# Patient Record
Sex: Female | Born: 1980 | Race: White | Hispanic: No | Marital: Single | Smoking: Current every day smoker
Health system: Southern US, Community
[De-identification: ages and names within clinical notes are randomized; demographics above are authoritative.]

## PROBLEM LIST (undated history)

## (undated) DIAGNOSIS — Z8711 Personal history of peptic ulcer disease: Secondary | ICD-10-CM

## (undated) DIAGNOSIS — N809 Endometriosis, unspecified: Secondary | ICD-10-CM

## (undated) DIAGNOSIS — Z8719 Personal history of other diseases of the digestive system: Secondary | ICD-10-CM

## (undated) HISTORY — PX: ABDOMINAL SURGERY: SHX537

---
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
## 2002-10-19 ENCOUNTER — Other Ambulatory Visit: Admission: RE | Admit: 2002-10-19 | Discharge: 2002-10-19 | Payer: Self-pay | Admitting: Obstetrics and Gynecology

## 2002-12-01 ENCOUNTER — Encounter: Payer: Self-pay | Admitting: *Deleted

## 2002-12-01 ENCOUNTER — Emergency Department (HOSPITAL_COMMUNITY): Admission: EM | Admit: 2002-12-01 | Discharge: 2002-12-01 | Payer: Self-pay

## 2003-08-22 ENCOUNTER — Other Ambulatory Visit: Admission: RE | Admit: 2003-08-22 | Discharge: 2003-08-22 | Payer: Self-pay | Admitting: Obstetrics and Gynecology

## 2003-11-09 ENCOUNTER — Ambulatory Visit (HOSPITAL_COMMUNITY): Admission: RE | Admit: 2003-11-09 | Discharge: 2003-11-09 | Payer: Self-pay | Admitting: Obstetrics and Gynecology

## 2004-02-15 ENCOUNTER — Inpatient Hospital Stay (HOSPITAL_COMMUNITY): Admission: AD | Admit: 2004-02-15 | Discharge: 2004-02-19 | Payer: Self-pay | Admitting: Obstetrics and Gynecology

## 2004-02-16 ENCOUNTER — Encounter (INDEPENDENT_AMBULATORY_CARE_PROVIDER_SITE_OTHER): Payer: Self-pay | Admitting: *Deleted

## 2005-01-15 ENCOUNTER — Emergency Department (HOSPITAL_COMMUNITY): Admission: EM | Admit: 2005-01-15 | Discharge: 2005-01-15 | Payer: Self-pay | Admitting: Emergency Medicine

## 2005-09-15 ENCOUNTER — Ambulatory Visit: Payer: Self-pay | Admitting: Gastroenterology

## 2005-09-17 ENCOUNTER — Ambulatory Visit: Payer: Self-pay | Admitting: Gastroenterology

## 2005-10-07 ENCOUNTER — Ambulatory Visit: Payer: Self-pay | Admitting: Gastroenterology

## 2006-08-20 ENCOUNTER — Ambulatory Visit (HOSPITAL_COMMUNITY): Admission: RE | Admit: 2006-08-20 | Discharge: 2006-08-20 | Payer: Self-pay | Admitting: Obstetrics and Gynecology

## 2007-10-04 ENCOUNTER — Emergency Department (HOSPITAL_COMMUNITY): Admission: EM | Admit: 2007-10-04 | Discharge: 2007-10-04 | Payer: Self-pay | Admitting: Family Medicine

## 2008-04-27 HISTORY — PX: OTHER SURGICAL HISTORY: SHX169

## 2008-04-27 HISTORY — PX: TUBAL LIGATION: SHX77

## 2009-01-11 ENCOUNTER — Inpatient Hospital Stay (HOSPITAL_COMMUNITY): Admission: AD | Admit: 2009-01-11 | Discharge: 2009-01-11 | Payer: Self-pay | Admitting: Obstetrics and Gynecology

## 2009-01-21 ENCOUNTER — Inpatient Hospital Stay (HOSPITAL_COMMUNITY): Admission: RE | Admit: 2009-01-21 | Discharge: 2009-01-24 | Payer: Self-pay | Admitting: Obstetrics and Gynecology

## 2009-01-21 ENCOUNTER — Encounter (INDEPENDENT_AMBULATORY_CARE_PROVIDER_SITE_OTHER): Payer: Self-pay | Admitting: Obstetrics and Gynecology

## 2009-04-30 ENCOUNTER — Emergency Department (HOSPITAL_COMMUNITY): Admission: EM | Admit: 2009-04-30 | Discharge: 2009-04-30 | Payer: Self-pay | Admitting: Emergency Medicine

## 2010-04-10 ENCOUNTER — Emergency Department (HOSPITAL_COMMUNITY)
Admission: EM | Admit: 2010-04-10 | Discharge: 2010-04-10 | Payer: Self-pay | Source: Home / Self Care | Admitting: Emergency Medicine

## 2010-07-13 LAB — POCT I-STAT, CHEM 8
BUN: 6 mg/dL (ref 6–23)
Calcium, Ion: 1.2 mmol/L (ref 1.12–1.32)
Chloride: 102 mEq/L (ref 96–112)
Creatinine, Ser: 0.6 mg/dL (ref 0.4–1.2)
Glucose, Bld: 113 mg/dL — ABNORMAL HIGH (ref 70–99)
HCT: 37 % (ref 36.0–46.0)
Hemoglobin: 12.6 g/dL (ref 12.0–15.0)
Potassium: 4 mEq/L (ref 3.5–5.1)
Sodium: 138 mEq/L (ref 135–145)
TCO2: 29 mmol/L (ref 0–100)

## 2010-08-01 LAB — COMPREHENSIVE METABOLIC PANEL
ALT: 13 U/L (ref 0–35)
Alkaline Phosphatase: 145 U/L — ABNORMAL HIGH (ref 39–117)
CO2: 23 mEq/L (ref 19–32)
Calcium: 9 mg/dL (ref 8.4–10.5)
Creatinine, Ser: 0.63 mg/dL (ref 0.4–1.2)
Glucose, Bld: 76 mg/dL (ref 70–99)
Total Bilirubin: 0.4 mg/dL (ref 0.3–1.2)

## 2010-08-01 LAB — CBC
HCT: 30.9 % — ABNORMAL LOW (ref 36.0–46.0)
Hemoglobin: 12 g/dL (ref 12.0–15.0)
MCHC: 33.9 g/dL (ref 30.0–36.0)
MCV: 98.5 fL (ref 78.0–100.0)
RBC: 3.59 MIL/uL — ABNORMAL LOW (ref 3.87–5.11)
RDW: 15 % (ref 11.5–15.5)
WBC: 8.8 10*3/uL (ref 4.0–10.5)

## 2010-08-01 LAB — DIFFERENTIAL
Basophils Absolute: 0 10*3/uL (ref 0.0–0.1)
Eosinophils Relative: 0 % (ref 0–5)
Lymphocytes Relative: 19 % (ref 12–46)
Lymphs Abs: 1.7 10*3/uL (ref 0.7–4.0)
Monocytes Absolute: 0.4 10*3/uL (ref 0.1–1.0)

## 2010-08-01 LAB — RPR: RPR Ser Ql: NONREACTIVE

## 2010-09-12 NOTE — Discharge Summary (Signed)
NAMEMAKAYLIA, [name] NO.:  [id number]   MEDICAL RECORD NO.:  [id number]          PATIENT TYPE:  INP   LOCATION:  9117                          FACILITY:  [facility]   PHYSICIAN:  Malachi Pro. Ambrose Mantle, M.D. DATE OF BIRTH:  17-Oct-1980   DATE OF ADMISSION:  02/15/2004  DATE OF DISCHARGE:                                 DISCHARGE SUMMARY   HOSPITAL COURSE:  This is a 30 year old white single female para 0 gravida  1; EDC February 22, 2004; admitted for induction of labor.  Blood group and  type A positive, negative antibody, nonreactive serology, rubella immune,  hepatitis B surface antigen negative, HIV declined, GC and chlamydia  negative, triple screen normal, 1-hour Glucola 139, group B strep negative.  The patient's prenatal course and progress in labor is detailed in the  present illness.  In summary, she reached close to full dilatation, remained  at 9 cm, and underwent a low transverse cervical C-section by Dr. Ambrose Mantle  under epidural anesthesia for arrest of dilatation, relative CPD, and  persistent occiput posterior.  Postpartum, the patient did well and was  discharged on postpartum day #3.   LABORATORY DATA:  Initial hemoglobin 10.9; hematocrit 32.0; white count  8900; platelet count 311,000.  Follow-up hemoglobin 8.6, hematocrit 25.4.  RPR nonreactive.   FINAL DIAGNOSIS:  1.  Intrauterine pregnancy at 39 weeks, delivered by cesarean section.  2.  Relative cephalopelvic disproportion.  3.  Arrest of dilatation at 9 cm.  4.  Persistent occiput posterior.   OPERATION:  Low transverse cervical C-section.   FINAL CONDITION:  Improved.   Instructions include our regular discharge instruction booklet.  The patient  is advised to return to the office in 10-14 days for follow-up examination.  Percocet 5/325 #24 tablets one q.4-6h. as needed for pain is given at  discharge.      TFH/MEDQ  D:  02/19/2004  T:  02/19/2004  Job:  [id number]

## 2010-09-12 NOTE — Op Note (Signed)
NAMEBRANDON, Erika [name]              ACCOUNT NO.:  [id number]   MEDICAL RECORD NO.:  [id number]          PATIENT TYPE:  INP   LOCATION:  9165                          FACILITY:  [facility]   PHYSICIAN:  Malachi Pro. Ambrose Mantle, M.D. DATE OF BIRTH:  [birth year]   DATE OF PROCEDURE:  02/16/2004  DATE OF DISCHARGE:                                 OPERATIVE REPORT   PREOPERATIVE DIAGNOSES:  1.  Intrauterine pregnancy at 39 weeks.  2.  Arrest of dilatation.  3.  Relative cephalopelvic disproportion presumed persistent OP.   POSTOPERATIVE DIAGNOSES:  1.  Intrauterine pregnancy at 39 weeks.  2.  Arrest of dilatation.  3.  Relative cephalopelvic disproportion presumed persistent OP.   OPERATION:  Low transverse cervical cesarean section.   SURGEON:  Malachi Pro. Ambrose Mantle, M.D.   ANESTHESIA:  Epidural.   DESCRIPTION OF PROCEDURE:  The patient was brought to the operating room and  the epidural anesthetic was boosted. Fetal heart tones were confirmed to be  normal. The abdomen was prepped with Betadine solution and draped as a  sterile field. Anesthesia was confirmed, a transverse incision was made and  carried in layers through the skin, subcutaneous tissue and fascia. The  fascia was then separated from the rectus muscle superiorly and inferiorly.  The rectus muscle was cut in the midline, peritoneum was opened vertically.  Exploration of the lower uterine segment revealed the bladder to be well  below the operative field. I made an incision into the uterus and then made  my way into the amniotic sac with blunt dissection. I enlarged the incision  by pulling superiorly and inferiorly, reached down, confirmed that the  vertex was in an OP position.  I rotated the vertex around, it delivered  through the incisional opening, the nose and pharynx were suctioned with the  bulb. The baby was delivered through the incisional opening with fundal  pressure and with my fingers in the baby's axilla. The cord was  clamped, the  infant was given to Angelita Ingles, M.D. who was in attendance. He assigned  the 7 pound 14 ounce female Apgar's of 8 at 1 and 9 at 5 minutes. A segment  of cord was preserved in case a pH was necessary but when the Apgar's were  normal, I discarded the loop of cord. Routine cord blood studies were  obtained, the placenta was then removed. The inside of the uterus was  inspected and found to be free of debris. I did manually massage the uterus  for 2 or 3 minutes trying to get real good tone before I turned by attention  to the incision. I grasped the incision with ring forceps, closed the  incision with two sutures of #0 Vicryl locking the first layer, nonlocking  on the second layer. Liberal irrigation confirmed hemostasis, the uterus,  tubes and ovaries were all normal. I did not reapproximate the visceral  peritoneum or the parietoperitoneum. I closed the rectus muscle with  interrupted sutures of #0 Vicryl, the fascia with two running sutures of  what we presumed to be #0 Vicryl although I had  been given the wrong suture  and the left side of her incision was closed with 3-0 Vicryl. I was not  satisfied with 3-0 Vicryl closing the fascia so the left side of the fascial  incision was reinforced with multiple interrupted figure-of-eight sutures of  #0 Vicryl. The  subcu tissue was then closed with a running 3-0 Vicryl and the skin was  closed with automatic staples. The patient seemed to tolerate the procedure  well. Blood loss was about 1000 mL.  Sponge and needle counts were correct  and the patient was returned to recovery in satisfactory condition.      TFH/MEDQ  D:  02/16/2004  T:  02/16/2004  Job:  [id number]

## 2010-09-12 NOTE — H&P (Signed)
NAMEKALEIGHA, CHAMBERLIN              ACCOUNT NO.:  [id number]   MEDICAL RECORD NO.:  [id number]          PATIENT TYPE:  INP   LOCATION:  9165                          [facility]ACILITY:  [facility]   PHYSICIAN:  Malachi Pro. Ambrose Mantle, M.D. DATE OF BIRTH:  08/08/1980   DATE OF ADMISSION:  02/15/2004  DATE OF DISCHARGE:                                HISTORY & PHYSICAL   HISTORY OF PRESENT ILLNESS:  A 30 year old white single female, para 0,  gravida 1, EDC February 22, 2004, by dates and ultrasound, admitted for  induction of labor.  Blood group and type A positive, negative antibody,  Nonreactive serology.  Rubella immune.  Hepatitis B surface antigen  negative.  HIV declined.  GC and Chlamydia negative.  Triple screen normal.  One-hour Glucola 139.  Group G strep negative.  Vaginal ultrasound on August 06, 2003:  Crown-rump length 4.61 cm, 11 weeks 3 days, [facility] February 22, 2004.  Repeat ultrasound on October 22, 2003, average gestational age [redacted] weeks 4 days,  [facility] February 22, 2004.  The patient was exposed to Parvovirus.  IgG was 6.2,  IgM was 0.1, indicating immunity.  Prenatal care was uncomplicated.  The  patient was admitted for induction.   PAST MEDICAL HISTORY:  No known drug allergies.   Illnesses:  History of migraines.   Operations:  None.   FAMILY HISTORY:  Maternal grandfather with an MI and diabetes.  Mother with  thyroid dysfunction.   Alcohol, tobacco, and drugs:  None.   PHYSICAL EXAMINATION:  VITAL SIGNS:  On admission her temperature was 98.2,  blood pressure 110/66, pulse 103, respirations 20.  HEENT:  Normal.  CARDIAC:  Heart normal size and sounds, no murmurs.  CHEST:  Lungs clear to auscultation.  ABDOMEN:  Soft, fundal height 38 cm.  PELVIC:  On February 13, 2004, cervix was a fingertip, 50%, vertex at a -2.   Artificial rupture of membranes was attempted on admission but was  unsuccessful mainly because the patient is quite apprehensive about exams.  She was placed on Pitocin.   Pitocin was raised to 28 milliunits a minute.  At 3:15 p.m. her contractions were every two minutes but were painless.  The  cervix was a fingertip and 50%, vertex at a -2, and artificial rupture of  membranes produced clear fluid.  By 6:22 p.m., the Pitocin was at 30  milliunits a minute, contractions were every two to three minutes.  Cervix  per the nurse was 2-3 cm and 80%.  She failed to make much progress  thereafter for several hours and was given an epidural.  She then made slow  but steady progress to 9 cm dilatation at about 3 a.m. on February 16, 2004.  Thereafter, she failed to make progress.  The cervix remained 9 cm dilated.  I have examined her and found the vertex to be at a +1 station, quite  molded, but the cervix is still 9 cm and since approximately 5:20 a.m., the  patient has had some variable decelerations.  I am going to proceed with C-  section because of arrest of  dilatation at 9 cm.   IMPRESSION:  1.  Intrauterine pregnancy at 39 weeks.  2.  Arrest of dilatation.  3.  Suspected cephalopelvic disproportion.      TFH/MEDQ  D:  02/16/2004  T:  02/16/2004  Job:  [id number]

## 2010-09-12 NOTE — Discharge Summary (Signed)
NAMEJASLIN, [name]              ACCOUNT NO.:  [id number]   MEDICAL RECORD NO.:  [id number]          PATIENT TYPE:  OIB   LOCATION:  9305                          [facility]ACILITY:  [facility]   PHYSICIAN:  Malachi Pro. Ambrose Mantle, M.D. DATE OF BIRTH:  30-May-1980   DATE OF ADMISSION:  08/20/2006  DATE OF DISCHARGE:  08/20/2006                               DISCHARGE SUMMARY   HISTORY OF PRESENT ILLNESS:  This is a 30 year old white female para 1-0-  0-1 with chronic pelvic pain.  The patient was admitted through the  operating room after having undergone a diagnostic laparoscopy,  minilaparotomy and division of adhesions of the omentum to the uterus  and adhesions of the uterus to the anterior abdominal wall especially of  the anterior fundus and the left area at the left round ligament.  Because of the extended nature of the procedure the patient was admitted  to the hospital and kept on observation until approximately 8 hours  postop and she was discharged.   LABORATORY DATA:  Showed initial hemoglobin of 12.7, hematocrit 37.3,  white count 5600, platelet count 422,000.  Follow-up hemoglobin 11.6,  hematocrit 33.9.  Comprehensive metabolic profile was normal.   FINAL DIAGNOSIS:  Chronic pelvic pain.   OPERATION:  Diagnostic laparoscopy, minilaparotomy, division of  adhesions of the omentum to the uterus and the uterine adhesions to the  anterior abdominal wall and anterior fundus and the area of the left  round ligament.   FINAL CONDITION:  Improved.   Instructions include no vaginal entrance, no heavy lifting or strenuous  activity.  Call with any unusual problems. Percocet 5/325 24 tablets one  or two every 3-4 hours as needed for pain. The patient is return to the  office at 1 week for follow-up examination.      Malachi Pro. Ambrose Mantle, M.D.  Electronically Signed     TFH/MEDQ  D:  09/28/2006  T:  09/28/2006  Job:  [id number]

## 2010-09-12 NOTE — Op Note (Signed)
Erika [name], Erika [name] NO.:  [id number]   MEDICAL RECORD NO.:  [id number]          PATIENT TYPE:  AMB   LOCATION:  SDC                           [facility]ACILITY:  [facility]   PHYSICIAN:  Malachi Pro. Ambrose Mantle, M.D. DATE OF BIRTH:  10/31/1980   DATE OF PROCEDURE:  08/20/2006  DATE OF DISCHARGE:                               OPERATIVE REPORT   PREOPERATIVE DIAGNOSIS:  Chronic pelvic pain.   POSTOPERATIVE DIAGNOSIS:  Chronic pelvic pain, with adherence of the  anterior aspect of the uterus to the abdominal wall especially over the  mid part of the uterus and the area of the left round ligament.  The  right side was relatively free.   OPERATION:  Diagnostic laparoscopy, minilaparotomy with division of the  dense adhesions over the anterior aspect of the uterus to the anterior  abdominal wall and in the area of the left round ligament to the  abdominal wall.   OPERATOR:  Dr. Ambrose Mantle.   General anesthesia.  The patient was brought to the operating room and  placed under satisfactory general anesthesia and placed in [place]  stirrups.  It was noted after the patient was asleep that she had not  signed her permit, but she knew what was to be done.  We consulted with  the OR staff, and the idea was to do the operation and then have her  sign an addendum at the end of the operation.  The abdomen, vulva,  vagina and urethra were prepped with Betadine solution.  The bladder was  emptied with a Foley catheter.  Exam revealed the uterus to be midplane  and normal size.  The adnexa were free of masses.  The cervix was drawn  into the operative field, and a Hulka cannula was placed into the uterus  and attached to the posterior cervical lip.  The patient was placed in  Arthur stirrups already.  Her legs were drawn out a little bit.  The  lower part of the umbilicus was incised and identified the fascia.  I  opened the fascia with a knife and then entered the peritoneal cavity.  I placed a 0  Vicryl suture pursestring around the fascial opening, then  inserted the Hassan cannula into the peritoneal cavity.  I could tell  that there were dense adhesions to the abdominal wall that I did not  want to use electrical current on, so I removed the laparoscope, pulled  down the pursestring suture and then put an additional figure-of-eight  suture through the fascia.  Closed the subcu tissue with 3-0 Vicryl and  the skin with interrupted 3-0 plain catgut.  I then made probably a 2-  inch incision suprapubically through the old C-section scar, carried in  layers through the skin, subcutaneous tissue and fascia.  The fascia was  separated from the rectus muscles superiorly and inferiorly.  I entered  the peritoneal cavity.  The incision was not large enough to use a  normal retractor, so we just used Goulet retractors, [employer]  retractors and suture material to try to do the surgery.  With blunt and  sharp dissection, I tried to create the anterior cul-de-sac which I was  able to do.  The greatest part of the procedure was spent trying to  separate the left anterior aspect of the uterus and area of the round  ligament to the abdominal wall.  I was very cautious in that area  because I could not tell exactly where the tube and ovary originated.  Ultimately, I was able to determine that the tube and ovary on the left  originated well below where I was working, and then I felt more freedom  to divide the tissue in that area with more abandon.  At the end of the  procedure, the uterus had been completely separated from the abdominal  wall.  Both tubes and ovaries appeared normal and were well free of the  operative field.  I had used a 0 Vicryl suture in the uterus to provide  elevation.  I did find that hemostasis was complete, and I placed an  Interceed across the anterior aspect of the uterus in attempt to prevent  re-formation of the same scar tissue that she had had.  I used one pack;   it was removed.  I  closed the rectus muscle with 0 Vicryl interrupted sutures, the fascia  with multiple interrupted figure-of-eight sutures of 0 Vicryl, the subcu  with a running 3-0 Vicryl, and the skin was closed with 3-0 plain  catgut.  The patient seemed to tolerate the procedure well and returned  to recovery in satisfactory condition.      Malachi Pro. Ambrose Mantle, M.D.  Electronically Signed     TFH/MEDQ  D:  08/20/2006  T:  08/20/2006  Job:  [id number]
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
## 2011-02-20 ENCOUNTER — Emergency Department (HOSPITAL_COMMUNITY)
Admission: EM | Admit: 2011-02-20 | Discharge: 2011-02-21 | Disposition: A | Discharge status: Home / Self Care | Payer: Self-pay | Attending: Emergency Medicine | Admitting: Emergency Medicine

## 2011-02-20 ENCOUNTER — Emergency Department (HOSPITAL_COMMUNITY): Payer: Self-pay

## 2011-02-20 DIAGNOSIS — M25579 Pain in unspecified ankle and joints of unspecified foot: Secondary | ICD-10-CM | POA: Insufficient documentation

## 2011-02-20 DIAGNOSIS — W19XXXA Unspecified fall, initial encounter: Secondary | ICD-10-CM | POA: Insufficient documentation

## 2011-02-20 DIAGNOSIS — Y92009 Unspecified place in unspecified non-institutional (private) residence as the place of occurrence of the external cause: Secondary | ICD-10-CM | POA: Insufficient documentation

## 2011-02-20 DIAGNOSIS — S8263XA Displaced fracture of lateral malleolus of unspecified fibula, initial encounter for closed fracture: Secondary | ICD-10-CM | POA: Insufficient documentation

## 2011-11-05 ENCOUNTER — Emergency Department (HOSPITAL_COMMUNITY): Payer: Self-pay

## 2011-11-05 ENCOUNTER — Emergency Department (HOSPITAL_COMMUNITY)
Admission: EM | Admit: 2011-11-05 | Discharge: 2011-11-06 | Disposition: A | Discharge status: Home / Self Care | Payer: Self-pay | Attending: Emergency Medicine | Admitting: Emergency Medicine

## 2011-11-05 ENCOUNTER — Encounter (HOSPITAL_COMMUNITY): Payer: Self-pay | Admitting: *Deleted

## 2011-11-05 DIAGNOSIS — N949 Unspecified condition associated with female genital organs and menstrual cycle: Secondary | ICD-10-CM | POA: Insufficient documentation

## 2011-11-05 DIAGNOSIS — N390 Urinary tract infection, site not specified: Secondary | ICD-10-CM | POA: Insufficient documentation

## 2011-11-05 DIAGNOSIS — R102 Pelvic and perineal pain: Secondary | ICD-10-CM

## 2011-11-05 HISTORY — DX: Endometriosis, unspecified: N80.9

## 2011-11-05 LAB — CBC WITH DIFFERENTIAL/PLATELET
Basophils Absolute: 0 10*3/uL (ref 0.0–0.1)
HCT: 40.5 % (ref 36.0–46.0)
Hemoglobin: 13.9 g/dL (ref 12.0–15.0)
Lymphocytes Relative: 41 % (ref 12–46)
Lymphs Abs: 3.1 10*3/uL (ref 0.7–4.0)
Monocytes Absolute: 0.3 10*3/uL (ref 0.1–1.0)
Neutro Abs: 4 10*3/uL (ref 1.7–7.7)
RBC: 4.41 MIL/uL (ref 3.87–5.11)
RDW: 13.2 % (ref 11.5–15.5)
WBC: 7.5 10*3/uL (ref 4.0–10.5)

## 2011-11-05 LAB — POCT PREGNANCY, URINE: Preg Test, Ur: NEGATIVE

## 2011-11-05 LAB — URINALYSIS, ROUTINE W REFLEX MICROSCOPIC
Glucose, UA: NEGATIVE mg/dL
Nitrite: NEGATIVE

## 2011-11-05 LAB — URINE MICROSCOPIC-ADD ON

## 2011-11-05 LAB — COMPREHENSIVE METABOLIC PANEL
ALT: 20 U/L (ref 0–35)
AST: 24 U/L (ref 0–37)
CO2: 25 mEq/L (ref 19–32)
Chloride: 101 mEq/L (ref 96–112)
Creatinine, Ser: 0.7 mg/dL (ref 0.50–1.10)
GFR calc non Af Amer: >90 mL/min (ref >90)
Total Bilirubin: 0.2 mg/dL — ABNORMAL LOW (ref 0.3–1.2)

## 2011-11-05 LAB — WET PREP, GENITAL
WBC, Wet Prep HPF POC: NONE SEEN
Yeast Wet Prep HPF POC: NONE SEEN

## 2011-11-05 NOTE — ED Notes (Addendum)
LMP ended 06/28. Pt with hx of endometriosis and tubal ligation to ED c/o vaginal bleeding (blood clots) x 2 months when she has her period.  Yesterday she began experiencing lower back pain and abd cramping. Today she noticed 2 quarter sized blood clots in her underwear.  Pt has unprotected sex.

## 2011-11-05 NOTE — ED Provider Notes (Signed)
History     CSN: [id number]  Arrival date & time 11/05/11  2005   First MD Initiated Contact with Patient 11/05/11 2205      No chief complaint on file.   (Consider location/radiation/quality/duration/timing/severity/associated sxs/prior treatment) Patient is a 31 y.o. female presenting with female genitourinary complaint. The history is provided by the patient.  Female GU Problem Primary symptoms include pelvic pain and vaginal bleeding.  Primary symptoms include no discharge, no dyspareunia, no genital lesions, no genital pain, no genital rash, no genital itching, no genital odor and no dysuria. There has been no fever. This is a new problem. The current episode started yesterday. The problem occurs constantly. The problem has been gradually worsening. The symptoms occur at rest. She is not pregnant. She has not missed her period. Her LMP was weeks ago. The patient's menstrual history has been regular. The discharge was bloody. Pertinent negatives include no diaphoresis, no abdominal pain, no diarrhea, no nausea, no vomiting and no dizziness. She has tried NSAIDs for the symptoms. The treatment provided mild relief. Sexual activity: sexually active. There is no concern regarding sexually transmitted diseases. Associated medical issues include endometriosis and Cesarean section.    Past Medical History  Diagnosis Date  . Endometriosis     Past Surgical History  Procedure Date  . Cesarian 2010    2 cesarian  . Tubal ligation 2010    History reviewed. No pertinent family history.  History  Substance Use Topics  . Smoking status: Not on file  . Smokeless tobacco: Not on file  . Alcohol Use:     OB History    Grav Para Term Preterm Abortions TAB SAB Ect Mult Living                  Review of Systems  Constitutional: Negative for fever, chills, diaphoresis and fatigue.  HENT: Negative for ear pain, congestion, sore throat, facial swelling, mouth sores, trouble swallowing,  neck pain and neck stiffness.   Eyes: Negative.   Respiratory: Negative for apnea, cough, chest tightness, shortness of breath and wheezing.   Cardiovascular: Negative for chest pain, palpitations and leg swelling.  Gastrointestinal: Negative for nausea, vomiting, abdominal pain, diarrhea and abdominal distention.  Genitourinary: Positive for vaginal bleeding and pelvic pain. Negative for dysuria, hematuria, flank pain, vaginal discharge, difficulty urinating, menstrual problem and dyspareunia.  Musculoskeletal: Positive for back pain. Negative for gait problem.  Skin: Negative for rash and wound.  Neurological: Negative for dizziness, tremors, seizures, syncope, facial asymmetry, numbness and headaches.  Psychiatric/Behavioral: Negative.   All other systems reviewed and are negative.    Allergies  Tylenol  Home Medications  No current outpatient prescriptions on file.  BP 124/65  Pulse 77  Temp 99.1 F (37.3 C) (Oral)  Resp 18  SpO2 97%  Physical Exam  Nursing note and vitals reviewed. Constitutional: She is oriented to person, place, and time. She appears well-developed and well-nourished. No distress.  HENT:  Head: Normocephalic and atraumatic.  Right Ear: External ear normal.  Left Ear: External ear normal.  Nose: Nose normal.  Mouth/Throat: Oropharynx is clear and moist. No oropharyngeal exudate.  Eyes: Conjunctivae and EOM are normal. Pupils are equal, round, and reactive to light. Right eye exhibits no discharge. Left eye exhibits no discharge.  Neck: Normal range of motion. Neck supple. No JVD present. No tracheal deviation present. No thyromegaly present.  Cardiovascular: Normal rate, regular rhythm, normal heart sounds and intact distal pulses.  Exam reveals no gallop  and no friction rub.   No murmur heard. Pulmonary/Chest: Effort normal and breath sounds normal. No respiratory distress. She has no wheezes. She has no rales. She exhibits no tenderness.  Abdominal:  Soft. Bowel sounds are normal. She exhibits no distension. There is no tenderness. There is no rebound and no guarding.  Genitourinary: Vagina normal. There is no rash, tenderness or lesion on the right labia. There is no rash, tenderness or lesion on the left labia. Uterus is tender. Cervix exhibits no motion tenderness, no discharge and no friability. Right adnexum displays tenderness. Right adnexum displays no mass and no fullness. Left adnexum displays no mass, no tenderness and no fullness. No erythema, tenderness or bleeding around the vagina. No foreign body around the vagina. No signs of injury around the vagina.  Musculoskeletal: Normal range of motion.       Lumbar back: She exhibits tenderness. She exhibits no bony tenderness, no swelling, no edema and no deformity.       Pain with palpation and pressure of lower lateral lumbar area left worse than right.  Lymphadenopathy:    She has no cervical adenopathy.  Neurological: She is alert and oriented to person, place, and time. No cranial nerve deficit. Coordination normal.  Skin: Skin is warm. No rash noted. She is not diaphoretic.  Psychiatric: She has a normal mood and affect. Her behavior is normal. Judgment and thought content normal.    ED Course  Procedures (including critical care time)  Labs Reviewed  URINALYSIS, ROUTINE W REFLEX MICROSCOPIC - Abnormal; Notable for the following:    APPearance CLOUDY (*)     Leukocytes, UA TRACE (*)     All other components within normal limits  URINE MICROSCOPIC-ADD ON - Abnormal; Notable for the following:    Squamous Epithelial / LPF FEW (*)     Bacteria, UA MANY (*)     All other components within normal limits  WET PREP, GENITAL - Abnormal; Notable for the following:    Clue Cells Wet Prep HPF POC FEW (*)     All other components within normal limits  POCT PREGNANCY, URINE  GC/CHLAMYDIA PROBE AMP, GENITAL  CBC WITH DIFFERENTIAL  COMPREHENSIVE METABOLIC PANEL   No results  found.   No diagnosis found.    MDM  31 year old female patient with past medical history of endometriosis and cesarean section x2 presents with 2 days of ongoing pelvic pain that radiates to her back. Patient not at this time but essentially active with her husband monogamously for the past year. Patient without vaginal pain or discharge but has noticed 2 small clots today and thus came to the emergency department. Patient tolerating food with normal bowel movements. Patient with muscular skeletal back pain. Patient normal pelvic exam except for pain around the uterus and on the right side. We'll get a transvaginal ultrasound to evaluate for cysts.   Results for orders placed during the hospital encounter of 11/05/11  URINALYSIS, ROUTINE W REFLEX MICROSCOPIC      Component Value Range   Color, Urine YELLOW  YELLOW   APPearance CLOUDY (*) CLEAR   Specific Gravity, Urine 1.015  1.005 - 1.030   pH 7.0  5.0 - 8.0   Glucose, UA NEGATIVE  NEGATIVE mg/dL   Hgb urine dipstick NEGATIVE  NEGATIVE   Bilirubin Urine NEGATIVE  NEGATIVE   Ketones, ur NEGATIVE  NEGATIVE mg/dL   Protein, ur NEGATIVE  NEGATIVE mg/dL   Urobilinogen, UA 1.0  0.0 - 1.0 mg/dL  Nitrite NEGATIVE  NEGATIVE   Leukocytes, UA TRACE (*) NEGATIVE  POCT PREGNANCY, URINE      Component Value Range   Preg Test, Ur NEGATIVE  NEGATIVE  URINE MICROSCOPIC-ADD ON      Component Value Range   Squamous Epithelial / LPF FEW (*) RARE   WBC, UA 0-2  <3 WBC/hpf   Bacteria, UA MANY (*) RARE  WET PREP, GENITAL      Component Value Range   Yeast Wet Prep HPF POC NONE SEEN  NONE SEEN   Trich, Wet Prep NONE SEEN  NONE SEEN   Clue Cells Wet Prep HPF POC FEW (*) NONE SEEN   WBC, Wet Prep HPF POC NONE SEEN  NONE SEEN  CBC WITH DIFFERENTIAL      Component Value Range   WBC 7.5  4.0 - 10.5 K/uL   RBC 4.41  3.87 - 5.11 MIL/uL   Hemoglobin 13.9  12.0 - 15.0 g/dL   HCT 16.1  09.6 - 04.5 %   MCV 91.8  78.0 - 100.0 fL   MCH 31.5  26.0 - 34.0  pg   MCHC 34.3  30.0 - 36.0 g/dL   RDW 40.9  81.1 - 91.4 %   Platelets 330  150 - 400 K/uL   Neutrophils Relative 53  43 - 77 %   Neutro Abs 4.0  1.7 - 7.7 K/uL   Lymphocytes Relative 41  12 - 46 %   Lymphs Abs 3.1  0.7 - 4.0 K/uL   Monocytes Relative 4  3 - 12 %   Monocytes Absolute 0.3  0.1 - 1.0 K/uL   Eosinophils Relative 1  0 - 5 %   Eosinophils Absolute 0.1  0.0 - 0.7 K/uL   Basophils Relative 0  0 - 1 %   Basophils Absolute 0.0  0.0 - 0.1 K/uL  COMPREHENSIVE METABOLIC PANEL      Component Value Range   Sodium 139  135 - 145 mEq/L   Potassium 3.4 (*) 3.5 - 5.1 mEq/L   Chloride 101  96 - 112 mEq/L   CO2 25  19 - 32 mEq/L   Glucose, Bld 110 (*) 70 - 99 mg/dL   BUN 5 (*) 6 - 23 mg/dL   Creatinine, Ser 7.82  0.50 - 1.10 mg/dL   Calcium 9.6  8.4 - 95.6 mg/dL   Total Protein 7.8  6.0 - 8.3 g/dL   Albumin 3.9  3.5 - 5.2 g/dL   AST 24  0 - 37 U/L   ALT 20  0 - 35 U/L   Alkaline Phosphatase 72  39 - 117 U/L   Total Bilirubin 0.2 (*) 0.3 - 1.2 mg/dL   GFR calc non Af Amer >90  >90 mL/min   GFR calc Af Amer >90  >90 mL/min    Patient with evidence of urinary tract infection. She'll be treated with nitrofurantoin for the next 5 days. It is an uncomplicated cystitis. Patient awaiting results of the pelvic US and has remained stable in the ED. Should the results be normal patient will be able to discharged with follow up with OBGYN.  Case discussed with Dr. Lyndee Leo, MD 11/06/11 0104  Sherryl Manges, MD 11/06/11 2130

## 2011-11-06 MED ORDER — KETOROLAC TROMETHAMINE 60 MG/2ML IM SOLN
60.0000 mg | Freq: Once | INTRAMUSCULAR | Status: AC
  Administered 2011-11-06: 60 mg via INTRAMUSCULAR
  Filled 2011-11-06: qty 2

## 2011-11-06 MED ORDER — NITROFURANTOIN MONOHYD MACRO 100 MG PO CAPS: 100.0000 mg | ORAL_CAPSULE | Freq: Two times a day (BID) | ORAL | Status: AC

## 2011-11-06 NOTE — ED Provider Notes (Signed)
I saw and evaluated the patient, reviewed the resident's note and I agree with the findings and plan.  R pelvic pain x 3 days, hx endometriosis. Tolerating PO, normal BMs.  TTP R flank, RLQ, suprapubic, L flank.  No peritoneal signs. No pain at [facility] point.  Glynn Octave, MD 11/06/11 [phone]

## 2011-11-06 NOTE — ED Notes (Signed)
Patient not in room. Left prior to receiving discharge papers/instructions.

## 2011-11-06 NOTE — ED Provider Notes (Signed)
Nursing notes and vitals signs, including pulse oximetry, reviewed.  Summary of this visit's results, reviewed by myself:  Labs:  Results for orders placed during the hospital encounter of 11/05/11  URINALYSIS, ROUTINE W REFLEX MICROSCOPIC      Component Value Range   Color, Urine YELLOW  YELLOW   APPearance CLOUDY (*) CLEAR   Specific Gravity, Urine 1.015  1.005 - 1.030   pH 7.0  5.0 - 8.0   Glucose, UA NEGATIVE  NEGATIVE mg/dL   Hgb urine dipstick NEGATIVE  NEGATIVE   Bilirubin Urine NEGATIVE  NEGATIVE   Ketones, ur NEGATIVE  NEGATIVE mg/dL   Protein, ur NEGATIVE  NEGATIVE mg/dL   Urobilinogen, UA 1.0  0.0 - 1.0 mg/dL   Nitrite NEGATIVE  NEGATIVE   Leukocytes, UA TRACE (*) NEGATIVE  POCT PREGNANCY, URINE      Component Value Range   Preg Test, Ur NEGATIVE  NEGATIVE  URINE MICROSCOPIC-ADD ON      Component Value Range   Squamous Epithelial / LPF FEW (*) RARE   WBC, UA 0-2  <3 WBC/hpf   Bacteria, UA MANY (*) RARE  WET PREP, GENITAL      Component Value Range   Yeast Wet Prep HPF POC NONE SEEN  NONE SEEN   Trich, Wet Prep NONE SEEN  NONE SEEN   Clue Cells Wet Prep HPF POC FEW (*) NONE SEEN   WBC, Wet Prep HPF POC NONE SEEN  NONE SEEN  CBC WITH DIFFERENTIAL      Component Value Range   WBC 7.5  4.0 - 10.5 K/uL   RBC 4.41  3.87 - 5.11 MIL/uL   Hemoglobin 13.9  12.0 - 15.0 g/dL   HCT 82.9  56.2 - 13.0 %   MCV 91.8  78.0 - 100.0 fL   MCH 31.5  26.0 - 34.0 pg   MCHC 34.3  30.0 - 36.0 g/dL   RDW 86.5  78.4 - 69.6 %   Platelets 330  150 - 400 K/uL   Neutrophils Relative 53  43 - 77 %   Neutro Abs 4.0  1.7 - 7.7 K/uL   Lymphocytes Relative 41  12 - 46 %   Lymphs Abs 3.1  0.7 - 4.0 K/uL   Monocytes Relative 4  3 - 12 %   Monocytes Absolute 0.3  0.1 - 1.0 K/uL   Eosinophils Relative 1  0 - 5 %   Eosinophils Absolute 0.1  0.0 - 0.7 K/uL   Basophils Relative 0  0 - 1 %   Basophils Absolute 0.0  0.0 - 0.1 K/uL  COMPREHENSIVE METABOLIC PANEL      Component Value Range   Sodium 139  135 - 145 mEq/L   Potassium 3.4 (*) 3.5 - 5.1 mEq/L   Chloride 101  96 - 112 mEq/L   CO2 25  19 - 32 mEq/L   Glucose, Bld 110 (*) 70 - 99 mg/dL   BUN 5 (*) 6 - 23 mg/dL   Creatinine, Ser 2.95  0.50 - 1.10 mg/dL   Calcium 9.6  8.4 - 28.4 mg/dL   Total Protein 7.8  6.0 - 8.3 g/dL   Albumin 3.9  3.5 - 5.2 g/dL   AST 24  0 - 37 U/L   ALT 20  0 - 35 U/L   Alkaline Phosphatase 72  39 - 117 U/L   Total Bilirubin 0.2 (*) 0.3 - 1.2 mg/dL   GFR calc non Af Amer >90  >90 mL/min  GFR calc Af Amer >90  >90 mL/min    Imaging Studies: US Transvaginal Non-ob  Nov 24, 2011  *RADIOLOGY REPORT*  Clinical Data:  Vaginal bleeding.  Cramps.  TRANSABDOMINAL AND TRANSVAGINAL ULTRASOUND OF PELVIS DOPPLER ULTRASOUND OF OVARIES  Technique:  Both transabdominal and transvaginal ultrasound examinations of the pelvis were performed. Transabdominal technique was performed for global imaging of the pelvis including uterus, ovaries, adnexal regions, and pelvic cul-de-sac.  It was necessary to proceed with endovaginal exam following the transabdominal exam to visualize the ovaries.  Color and duplex Doppler ultrasound was utilized to evaluate blood flow to the ovaries.  Comparison:  None.  Findings:  Uterus:  8.3 x 3.0 x 4.2 cm.  No myometrial masses.  Endometrium:  6 mm in uniform.  Right ovary: Within normal limits.  Arterial and venous Doppler flow is documented.  Left ovary:   Within normal limits.  Arterial and venous Doppler flow is documented.  Pulsed Doppler evaluation demonstrates normal low-resistance arterial and venous waveforms in both ovaries.  IMPRESSION: Within normal limits.  No evidence of ovarian torsion.  No sonographic evidence for ovarian torsion.  Original Report Authenticated By: Donavan Burnet, M.D.   US Pelvis Complete  Nov 24, 2011  *RADIOLOGY REPORT*  Clinical Data:  Vaginal bleeding.  Cramps.  TRANSABDOMINAL AND TRANSVAGINAL ULTRASOUND OF PELVIS DOPPLER ULTRASOUND OF OVARIES  Technique:   Both transabdominal and transvaginal ultrasound examinations of the pelvis were performed. Transabdominal technique was performed for global imaging of the pelvis including uterus, ovaries, adnexal regions, and pelvic cul-de-sac.  It was necessary to proceed with endovaginal exam following the transabdominal exam to visualize the ovaries.  Color and duplex Doppler ultrasound was utilized to evaluate blood flow to the ovaries.  Comparison:  None.  Findings:  Uterus:  8.3 x 3.0 x 4.2 cm.  No myometrial masses.  Endometrium:  6 mm in uniform.  Right ovary: Within normal limits.  Arterial and venous Doppler flow is documented.  Left ovary:   Within normal limits.  Arterial and venous Doppler flow is documented.  Pulsed Doppler evaluation demonstrates normal low-resistance arterial and venous waveforms in both ovaries.  IMPRESSION: Within normal limits.  No evidence of ovarian torsion.  No sonographic evidence for ovarian torsion.  Original Report Authenticated By: Donavan Burnet, M.D.   [place] Art/ven Flow Abd Pelv Doppler  11-24-2011  *RADIOLOGY REPORT*  Clinical Data:  Vaginal bleeding.  Cramps.  TRANSABDOMINAL AND TRANSVAGINAL ULTRASOUND OF PELVIS DOPPLER ULTRASOUND OF OVARIES  Technique:  Both transabdominal and transvaginal ultrasound examinations of the pelvis were performed. Transabdominal technique was performed for global imaging of the pelvis including uterus, ovaries, adnexal regions, and pelvic cul-de-sac.  It was necessary to proceed with endovaginal exam following the transabdominal exam to visualize the ovaries.  Color and duplex Doppler ultrasound was utilized to evaluate blood flow to the ovaries.  Comparison:  None.  Findings:  Uterus:  8.3 x 3.0 x 4.2 cm.  No myometrial masses.  Endometrium:  6 mm in uniform.  Right ovary: Within normal limits.  Arterial and venous Doppler flow is documented.  Left ovary:   Within normal limits.  Arterial and venous Doppler flow is documented.  Pulsed Doppler  evaluation demonstrates normal low-resistance arterial and venous waveforms in both ovaries.  IMPRESSION: Within normal limits.  No evidence of ovarian torsion.  No sonographic evidence for ovarian torsion.  Original Report Authenticated By: Donavan Burnet, M.D.      Hanley Seamen, MD 11-24-11 0201

## 2012-01-15 ENCOUNTER — Emergency Department (INDEPENDENT_AMBULATORY_CARE_PROVIDER_SITE_OTHER)
Admission: EM | Admit: 2012-01-15 | Discharge: 2012-01-15 | Disposition: A | Discharge status: Home / Self Care | Payer: Self-pay | Source: Home / Self Care

## 2012-01-15 ENCOUNTER — Encounter (HOSPITAL_COMMUNITY): Payer: Self-pay

## 2012-01-15 DIAGNOSIS — J9801 Acute bronchospasm: Secondary | ICD-10-CM

## 2012-01-15 DIAGNOSIS — J069 Acute upper respiratory infection, unspecified: Secondary | ICD-10-CM

## 2012-01-15 DIAGNOSIS — J329 Chronic sinusitis, unspecified: Secondary | ICD-10-CM

## 2012-01-15 MED ORDER — HYDROCODONE-HOMATROPINE 5-1.5 MG/5ML PO SYRP: ORAL_SOLUTION | ORAL | Status: AC

## 2012-01-15 MED ORDER — TRIAMCINOLONE ACETONIDE 40 MG/ML IJ SUSP
40.0000 mg | Freq: Once | INTRAMUSCULAR | Status: AC
  Administered 2012-01-15: 40 mg via INTRAMUSCULAR

## 2012-01-15 MED ORDER — ALBUTEROL SULFATE (5 MG/ML) 0.5% IN NEBU
INHALATION_SOLUTION | RESPIRATORY_TRACT | Status: AC
  Filled 2012-01-15: qty 1

## 2012-01-15 MED ORDER — METHYLPREDNISOLONE 4 MG PO KIT: PACK | ORAL | Status: AC

## 2012-01-15 MED ORDER — IPRATROPIUM BROMIDE 0.02 % IN SOLN
0.5000 mg | Freq: Once | RESPIRATORY_TRACT | Status: AC
  Administered 2012-01-15: 0.5 mg via RESPIRATORY_TRACT

## 2012-01-15 MED ORDER — CEFUROXIME AXETIL 250 MG PO TABS: 250.0000 mg | ORAL_TABLET | Freq: Two times a day (BID) | ORAL | Status: AC

## 2012-01-15 MED ORDER — ALBUTEROL SULFATE HFA 108 (90 BASE) MCG/ACT IN AERS: 2.0000 | INHALATION_SPRAY | Freq: Four times a day (QID) | RESPIRATORY_TRACT | Status: AC | PRN

## 2012-01-15 MED ORDER — TRIAMCINOLONE ACETONIDE 40 MG/ML IJ SUSP
INTRAMUSCULAR | Status: AC
  Filled 2012-01-15: qty 5

## 2012-01-15 MED ORDER — IBUPROFEN 600 MG PO TABS
600.0000 mg | ORAL_TABLET | Freq: Once | ORAL | Status: AC
  Administered 2012-01-15: 600 mg via ORAL

## 2012-01-15 MED ORDER — ALBUTEROL SULFATE (5 MG/ML) 0.5% IN NEBU
2.5000 mg | INHALATION_SOLUTION | Freq: Once | RESPIRATORY_TRACT | Status: AC
  Administered 2012-01-15: 2.5 mg via RESPIRATORY_TRACT

## 2012-01-15 NOTE — ED Notes (Signed)
Reported 6 day duration of facial pain and swelling, cough; using OTC medications w minimal relief; facial redness, green secretions

## 2012-05-25 NOTE — ED Provider Notes (Signed)
History     CSN: [id number]  Arrival date & time 01/15/12  1846   None     Chief Complaint  Patient presents with  . Facial Pain    (Consider location/radiation/quality/duration/timing/severity/associated sxs/prior treatment) HPI Comments: Patient complaining of pain in the face with mild swelling accompanied by cough. The cough is associated with green secretions. Symptoms have been occurring for 6 days. Afebrile.   Past Medical History  Diagnosis Date  . Endometriosis     Past Surgical History  Procedure Date  . Cesarian 2010    2 cesarian  . Tubal ligation 2010    History reviewed. No pertinent family history.  History  Substance Use Topics  . Smoking status: Not on file  . Smokeless tobacco: Not on file  . Alcohol Use:     OB History    Grav Para Term Preterm Abortions TAB SAB Ect Mult Living                  Review of Systems  Constitutional: Negative.   HENT:       As per history of present illness  Respiratory: Positive for cough and wheezing. Negative for shortness of breath and stridor.   Cardiovascular: Negative.   Genitourinary: Negative.   Neurological: Negative.     Allergies  Tylenol  Home Medications   Current Outpatient Rx  Name  Route  Sig  Dispense  Refill  . ALBUTEROL SULFATE HFA 108 (90 BASE) MCG/ACT IN AERS   Inhalation   Inhale 2 puffs into the lungs every 6 (six) hours as needed for wheezing.   1 Inhaler   0   . CEFUROXIME AXETIL 250 MG PO TABS   Oral   Take 1 tablet (250 mg total) by mouth 2 (two) times daily.   20 tablet   0   . HYDROCODONE-HOMATROPINE 5-1.5 MG/5ML PO SYRP      1 tsp po q 4h prn cough and drainage   120 mL   0   . METHYLPREDNISOLONE 4 MG PO KIT      As directed   21 tablet   0     BP 140/70  Pulse 60  Temp 98.9 F (37.2 C) (Oral)  Resp 18  SpO2 96%  LMP 12/25/2011  Physical Exam  Nursing note and vitals reviewed. Constitutional: She is oriented to person, place, and time. She  appears well-developed and well-nourished. No distress.  HENT:  Right Ear: External ear normal.  Left Ear: External ear normal.  Mouth/Throat: Oropharynx is clear and moist.       Anterior facial tenderness  Neck: Normal range of motion. Neck supple.  Cardiovascular: Normal rate and normal heart sounds.   Pulmonary/Chest: Effort normal. She has wheezes.  Musculoskeletal: Normal range of motion.  Lymphadenopathy:    She has no cervical adenopathy.  Neurological: She is alert and oriented to person, place, and time. She exhibits normal muscle tone.  Skin: Skin is warm and dry.  Psychiatric: She has a normal mood and affect.    ED Course  Procedures (including critical care time)  Labs Reviewed - No data to display No results found.   1. Sinusitis   2. Bronchospasm   3. URI (upper respiratory infection)       MDM  Albuterol HFA 2 puffs every 6 hours as needed for wheezing Ceftin 250 mg twice a day Hycodan syrup 1 teaspoon q. 4 hours when necessary cough and drainage Many fluids stay well hydrated Medrol  Dosepak Old your doctor as needed.        Hayden Rasmussen, NP 05/25/12 1501

## 2012-05-25 NOTE — ED Provider Notes (Signed)
Medical screening examination/treatment/procedure(s) were performed by non-physician practitioner and as supervising physician I was immediately available for consultation/collaboration.  Raynald Blend, MD 05/25/12 1539

## 2014-05-04 ENCOUNTER — Emergency Department (HOSPITAL_COMMUNITY): Payer: Self-pay

## 2014-05-04 ENCOUNTER — Emergency Department (HOSPITAL_COMMUNITY)
Admission: EM | Admit: 2014-05-04 | Discharge: 2014-05-04 | Disposition: A | Discharge status: Home / Self Care | Payer: Self-pay | Attending: Emergency Medicine | Admitting: Emergency Medicine

## 2014-05-04 ENCOUNTER — Encounter (HOSPITAL_COMMUNITY): Payer: Self-pay | Admitting: Emergency Medicine

## 2014-05-04 DIAGNOSIS — Z720 Tobacco use: Secondary | ICD-10-CM | POA: Insufficient documentation

## 2014-05-04 DIAGNOSIS — Z79899 Other long term (current) drug therapy: Secondary | ICD-10-CM | POA: Insufficient documentation

## 2014-05-04 DIAGNOSIS — M545 Low back pain: Secondary | ICD-10-CM | POA: Insufficient documentation

## 2014-05-04 DIAGNOSIS — Z7952 Long term (current) use of systemic steroids: Secondary | ICD-10-CM | POA: Insufficient documentation

## 2014-05-04 DIAGNOSIS — R102 Pelvic and perineal pain: Secondary | ICD-10-CM | POA: Insufficient documentation

## 2014-05-04 DIAGNOSIS — Z9851 Tubal ligation status: Secondary | ICD-10-CM | POA: Insufficient documentation

## 2014-05-04 DIAGNOSIS — Z3202 Encounter for pregnancy test, result negative: Secondary | ICD-10-CM | POA: Insufficient documentation

## 2014-05-04 DIAGNOSIS — N898 Other specified noninflammatory disorders of vagina: Secondary | ICD-10-CM | POA: Insufficient documentation

## 2014-05-04 DIAGNOSIS — Z9889 Other specified postprocedural states: Secondary | ICD-10-CM | POA: Insufficient documentation

## 2014-05-04 DIAGNOSIS — G8929 Other chronic pain: Secondary | ICD-10-CM | POA: Insufficient documentation

## 2014-05-04 DIAGNOSIS — Z792 Long term (current) use of antibiotics: Secondary | ICD-10-CM | POA: Insufficient documentation

## 2014-05-04 LAB — CBC WITH DIFFERENTIAL/PLATELET
BASOS PCT: 0 % (ref 0–1)
Basophils Absolute: 0 10*3/uL (ref 0.0–0.1)
Eosinophils Absolute: 0.1 10*3/uL (ref 0.0–0.7)
Eosinophils Relative: 1 % (ref 0–5)
HEMATOCRIT: 38.8 % (ref 36.0–46.0)
HEMOGLOBIN: 12.8 g/dL (ref 12.0–15.0)
LYMPHS ABS: 2.5 10*3/uL (ref 0.7–4.0)
Lymphocytes Relative: 37 % (ref 12–46)
MCH: 31.2 pg (ref 26.0–34.0)
MCHC: 33 g/dL (ref 30.0–36.0)
MCV: 94.6 fL (ref 78.0–100.0)
MONOS PCT: 3 % (ref 3–12)
Monocytes Absolute: 0.2 10*3/uL (ref 0.1–1.0)
Neutro Abs: 4.1 10*3/uL (ref 1.7–7.7)
Neutrophils Relative %: 59 % (ref 43–77)
Platelets: 257 10*3/uL (ref 150–400)
RBC: 4.1 MIL/uL (ref 3.87–5.11)
RDW: 13.2 % (ref 11.5–15.5)
WBC: 6.8 10*3/uL (ref 4.0–10.5)

## 2014-05-04 LAB — URINALYSIS, ROUTINE W REFLEX MICROSCOPIC
BILIRUBIN URINE: NEGATIVE
Glucose, UA: NEGATIVE mg/dL
Hgb urine dipstick: NEGATIVE
KETONES UR: NEGATIVE mg/dL
Leukocytes, UA: NEGATIVE
NITRITE: NEGATIVE
Protein, ur: NEGATIVE mg/dL
Specific Gravity, Urine: 1.009 (ref 1.005–1.030)
UROBILINOGEN UA: 0.2 mg/dL (ref 0.0–1.0)
pH: 7.5 (ref 5.0–8.0)

## 2014-05-04 LAB — COMPREHENSIVE METABOLIC PANEL
ALT: 17 U/L (ref 0–35)
ANION GAP: 4 — AB (ref 5–15)
AST: 21 U/L (ref 0–37)
Albumin: 3.8 g/dL (ref 3.5–5.2)
Alkaline Phosphatase: 57 U/L (ref 39–117)
BILIRUBIN TOTAL: 0.4 mg/dL (ref 0.3–1.2)
BUN: 8 mg/dL (ref 6–23)
CALCIUM: 9.1 mg/dL (ref 8.4–10.5)
CO2: 26 mmol/L (ref 19–32)
CREATININE: 0.77 mg/dL (ref 0.50–1.10)
Chloride: 106 mEq/L (ref 96–112)
GFR calc Af Amer: >90 mL/min (ref >90)
GFR calc non Af Amer: >90 mL/min (ref >90)
Glucose, Bld: 116 mg/dL — ABNORMAL HIGH (ref 70–99)
POTASSIUM: 3.8 mmol/L (ref 3.5–5.1)
Sodium: 136 mmol/L (ref 135–145)
TOTAL PROTEIN: 6.9 g/dL (ref 6.0–8.3)

## 2014-05-04 LAB — POC URINE PREG, ED: Preg Test, Ur: NEGATIVE

## 2014-05-04 LAB — WET PREP, GENITAL
TRICH WET PREP: NONE SEEN
WBC WET PREP: NONE SEEN
YEAST WET PREP: NONE SEEN

## 2014-05-04 MED ORDER — IBUPROFEN 800 MG PO TABS: 800.0000 mg | ORAL_TABLET | Freq: Three times a day (TID) | ORAL | Status: AC

## 2014-05-04 NOTE — ED Notes (Signed)
Pt made aware to return if symptoms worsen or if any life threatening symptoms occur.   

## 2014-05-04 NOTE — ED Provider Notes (Signed)
CSN: [id number]     Arrival date & time 05/04/14  0946 History   First MD Initiated Contact with Patient 05/04/14 [phone]     Chief Complaint  Patient presents with  . Abdominal Pain  . Back Pain     (Consider location/radiation/quality/duration/timing/severity/associated sxs/prior Treatment) HPI Comments: Patient with a history of Endometriosis presents today with right sided abdominal pain.  She reports that the pain has been present intermittently over the past 3 days.  She is currently on Methadone and has not taken anything additional for the pain.  She reports associated yellowish colored vaginal discharge over the past 3 days.  She denies fever, chills, nausea, vomiting, diarrhea, or urinary symptoms.  Normal appetite.  She also reports that she has had pain of her right lower back.  She has a history of chronic back pain and is unsure if this pain is related to her abdominal pain.  No prior history of Kidney Stones.  She denies prior history of Gallstones.  She has had two C-sections, but no other abdominal surgeries.   Patient is a 34 y.o. female presenting with abdominal pain and back pain. The history is provided by the patient.  Abdominal Pain Back Pain Associated symptoms: abdominal pain     Past Medical History  Diagnosis Date  . Endometriosis    Past Surgical History  Procedure Laterality Date  . Cesarian  2010    2 cesarian  . Tubal ligation  2010   No family history on file. History  Substance Use Topics  . Smoking status: Current Every Day Smoker  . Smokeless tobacco: Not on file  . Alcohol Use: No   OB History    No data available     Review of Systems  Gastrointestinal: Positive for abdominal pain.  Musculoskeletal: Positive for back pain.  All other systems reviewed and are negative.     Allergies  Tylenol  Home Medications   Prior to Admission medications   Medication Sig Start Date End Date Taking? Authorizing Provider  albuterol (PROVENTIL  HFA;VENTOLIN HFA) 108 (90 BASE) MCG/ACT inhaler Inhale 2 puffs into the lungs every 6 (six) hours as needed for wheezing. 01/15/12   Hayden Rasmussenavid Mabe, NP  cefUROXime (CEFTIN) 250 MG tablet Take 1 tablet (250 mg total) by mouth 2 (two) times daily. 01/15/12   Hayden Rasmussenavid Mabe, NP  HYDROcodone-homatropine [facility](HYCODAN) 5-1.5 MG/5ML syrup 1 tsp po q 4h prn cough and drainage 01/15/12   Hayden Rasmussenavid Mabe, NP  methylPREDNISolone (MEDROL DOSEPAK) 4 MG tablet As directed 01/15/12   Hayden Rasmussenavid Mabe, NP   BP 120/48 mmHg  Pulse 93  Temp(Src) 98.2 F (36.8 C) (Oral)  Resp 20  Ht 5' (1.524 m)  Wt 130 lb (58.968 kg)  BMI 25.39 kg/m2  SpO2 99%  LMP 04/20/2014 Physical Exam  Constitutional: She appears well-developed and well-nourished.  HENT:  Head: Normocephalic and atraumatic.  Mouth/Throat: Oropharynx is clear and moist.  Neck: Normal range of motion. Neck supple.  Cardiovascular: Normal rate, regular rhythm and normal heart sounds.   Pulmonary/Chest: Effort normal and breath sounds normal.  Abdominal: Soft. Bowel sounds are normal. She exhibits no distension and no mass. There is tenderness in the right upper quadrant and right lower quadrant. There is no rebound, no guarding and negative Murphy's sign.  Tenderness along the right side of the abdomen.    Genitourinary: Cervix exhibits no motion tenderness. Right adnexum displays tenderness. Right adnexum displays no mass and no fullness. Left adnexum displays no mass, no  tenderness and no fullness.  Musculoskeletal: Normal range of motion.  Neurological: She is alert.  Skin: Skin is warm and dry.  Psychiatric: She has a normal mood and affect.  Nursing note and vitals reviewed.   ED Course  Procedures (including critical care time) Labs Review Labs Reviewed  GC/CHLAMYDIA PROBE AMP  WET PREP, GENITAL  CBC WITH DIFFERENTIAL  COMPREHENSIVE METABOLIC PANEL  URINALYSIS, ROUTINE W REFLEX MICROSCOPIC  POC URINE PREG, ED    Imaging Review US Transvaginal  Non-ob  05/04/2014   CLINICAL DATA:  Right adnexal tenderness.  EXAM: TRANSABDOMINAL AND TRANSVAGINAL ULTRASOUND OF PELVIS  DOPPLER ULTRASOUND OF OVARIES  TECHNIQUE: Both transabdominal and transvaginal ultrasound examinations of the pelvis were performed. Transabdominal technique was performed for global imaging of the pelvis including uterus, ovaries, adnexal regions, and pelvic cul-de-sac.  It was necessary to proceed with endovaginal exam following the transabdominal exam to visualize the ovaries. Color and duplex Doppler ultrasound was utilized to evaluate blood flow to the ovaries.  COMPARISON:  Ultrasound of November 05, 2011.  FINDINGS: Uterus  Measurements: 8.7 x 5.3 x 4.1 cm. No fibroids or other mass visualized.  Endometrium  Thickness: 7 mm.  No focal abnormality visualized.  Right ovary  Measurements: 2.6 x 2.3 x 1.6 cm. Multiple small follicular cysts are noted.  Left ovary  Measurements: 3.3 x 2.8 x 1.9 cm. Multiple follicular cysts are noted with the largest measuring 1 cm.  Pulsed Doppler evaluation of both ovaries demonstrates normal low-resistance arterial and venous waveforms.  Other findings  No free fluid.  IMPRESSION: No abnormality seen in the pelvis.   Electronically Signed   By: Roque Lias M.D.   On: 05/04/2014 13:32   US Pelvis Complete  05/04/2014   CLINICAL DATA:  Right adnexal tenderness.  EXAM: TRANSABDOMINAL AND TRANSVAGINAL ULTRASOUND OF PELVIS  DOPPLER ULTRASOUND OF OVARIES  TECHNIQUE: Both transabdominal and transvaginal ultrasound examinations of the pelvis were performed. Transabdominal technique was performed for global imaging of the pelvis including uterus, ovaries, adnexal regions, and pelvic cul-de-sac.  It was necessary to proceed with endovaginal exam following the transabdominal exam to visualize the ovaries. Color and duplex Doppler ultrasound was utilized to evaluate blood flow to the ovaries.  COMPARISON:  Ultrasound of November 05, 2011.  FINDINGS: Uterus  Measurements:  8.7 x 5.3 x 4.1 cm. No fibroids or other mass visualized.  Endometrium  Thickness: 7 mm.  No focal abnormality visualized.  Right ovary  Measurements: 2.6 x 2.3 x 1.6 cm. Multiple small follicular cysts are noted.  Left ovary  Measurements: 3.3 x 2.8 x 1.9 cm. Multiple follicular cysts are noted with the largest measuring 1 cm.  Pulsed Doppler evaluation of both ovaries demonstrates normal low-resistance arterial and venous waveforms.  Other findings  No free fluid.  IMPRESSION: No abnormality seen in the pelvis.   Electronically Signed   By: Roque Lias M.D.   On: 05/04/2014 13:32   [place] Art/ven Flow Abd Pelv Doppler  05/04/2014   CLINICAL DATA:  Right adnexal tenderness.  EXAM: TRANSABDOMINAL AND TRANSVAGINAL ULTRASOUND OF PELVIS  DOPPLER ULTRASOUND OF OVARIES  TECHNIQUE: Both transabdominal and transvaginal ultrasound examinations of the pelvis were performed. Transabdominal technique was performed for global imaging of the pelvis including uterus, ovaries, adnexal regions, and pelvic cul-de-sac.  It was necessary to proceed with endovaginal exam following the transabdominal exam to visualize the ovaries. Color and duplex Doppler ultrasound was utilized to evaluate blood flow to the ovaries.  COMPARISON:  Ultrasound  of November 05, 2011.  FINDINGS: Uterus  Measurements: 8.7 x 5.3 x 4.1 cm. No fibroids or other mass visualized.  Endometrium  Thickness: 7 mm.  No focal abnormality visualized.  Right ovary  Measurements: 2.6 x 2.3 x 1.6 cm. Multiple small follicular cysts are noted.  Left ovary  Measurements: 3.3 x 2.8 x 1.9 cm. Multiple follicular cysts are noted with the largest measuring 1 cm.  Pulsed Doppler evaluation of both ovaries demonstrates normal low-resistance arterial and venous waveforms.  Other findings  No free fluid.  IMPRESSION: No abnormality seen in the pelvis.   Electronically Signed   By: Roque Lias M.D.   On: 05/04/2014 13:32     EKG Interpretation None     1:58 PM Reassessed patient.   She has mild pain of the RUQ and RLQ of the abdomen.  No rebound or guarding.  No CVA tenderness.  Negative Rovsing's sign.  Negative Murphy's sign.   MDM   Final diagnoses:  None   Patient presents today with right sided abdominal pain that has been intermittent over the past 3 days.  She has a history of Endometriosis and reports that she has had similar pain with endometriosis in the past.  Urine pregnancy negative.  On pelvic exam, she had right adnexal tenderness.  Therefore, pelvic ultrasound ordered.  Pelvic ultrasound is negative.  Labs today unremarkable.  No leukocytosis.  She is afebrile.  No rebound or guarding on exam.  Therefore, do not feel that additional imaging is indicated.  No evidence of infection with UA.  UA not showing any microscopic hemoglobin. Therefore, feel kidney stone is less likely.  Feel that the patient is stable for discharge.  Strict return precautions given.  She is in agreement with the plan.     Santiago Glad, PA-C 05/07/14 8119  Purvis Sheffield, MD 05/07/14 [phone]

## 2014-05-04 NOTE — ED Notes (Signed)
Pt c/o right sided abdominal pain and right low back pain x 3 days. Pt denies urinary symptoms at present, reports small amount of yellowish vaginal discharge.

## 2014-05-04 NOTE — ED Notes (Signed)
Pt taken to US

## 2014-05-05 LAB — GC/CHLAMYDIA PROBE AMP
CT Probe RNA: NEGATIVE
GC PROBE AMP APTIMA: NEGATIVE

## 2015-11-19 ENCOUNTER — Encounter: Payer: Self-pay | Admitting: Emergency Medicine

## 2015-11-19 ENCOUNTER — Emergency Department
Admission: EM | Admit: 2015-11-19 | Discharge: 2015-11-19 | Disposition: A | Discharge status: Home / Self Care | Payer: Self-pay | Attending: Emergency Medicine | Admitting: Emergency Medicine

## 2015-11-19 DIAGNOSIS — K2970 Gastritis, unspecified, without bleeding: Secondary | ICD-10-CM | POA: Insufficient documentation

## 2015-11-19 DIAGNOSIS — Z79899 Other long term (current) drug therapy: Secondary | ICD-10-CM | POA: Insufficient documentation

## 2015-11-19 DIAGNOSIS — Z792 Long term (current) use of antibiotics: Secondary | ICD-10-CM | POA: Insufficient documentation

## 2015-11-19 DIAGNOSIS — F17200 Nicotine dependence, unspecified, uncomplicated: Secondary | ICD-10-CM | POA: Insufficient documentation

## 2015-11-19 DIAGNOSIS — Z8719 Personal history of other diseases of the digestive system: Secondary | ICD-10-CM | POA: Insufficient documentation

## 2015-11-19 HISTORY — DX: Personal history of other diseases of the digestive system: Z87.19

## 2015-11-19 HISTORY — DX: Personal history of peptic ulcer disease: Z87.11

## 2015-11-19 LAB — LIPASE, BLOOD: LIPASE: 15 U/L (ref 11–51)

## 2015-11-19 LAB — URINALYSIS COMPLETE WITH MICROSCOPIC (ARMC ONLY)
BACTERIA UA: NONE SEEN
Bilirubin Urine: NEGATIVE
Glucose, UA: NEGATIVE mg/dL
HGB URINE DIPSTICK: NEGATIVE
LEUKOCYTES UA: NEGATIVE
NITRITE: NEGATIVE
PROTEIN: NEGATIVE mg/dL
SPECIFIC GRAVITY, URINE: 1.017 (ref 1.005–1.030)
pH: 5 (ref 5.0–8.0)

## 2015-11-19 LAB — CBC
HEMATOCRIT: 41.4 % (ref 35.0–47.0)
HEMOGLOBIN: 14.3 g/dL (ref 12.0–16.0)
MCH: 32.4 pg (ref 26.0–34.0)
MCHC: 34.5 g/dL (ref 32.0–36.0)
MCV: 94 fL (ref 80.0–100.0)
Platelets: 195 10*3/uL (ref 150–440)
RBC: 4.41 MIL/uL (ref 3.80–5.20)
RDW: 13.7 % (ref 11.5–14.5)
WBC: 11.8 10*3/uL — AB (ref 3.6–11.0)

## 2015-11-19 LAB — COMPREHENSIVE METABOLIC PANEL
ALT: 12 U/L — ABNORMAL LOW (ref 14–54)
ANION GAP: 9 (ref 5–15)
AST: 21 U/L (ref 15–41)
Albumin: 4.4 g/dL (ref 3.5–5.0)
Alkaline Phosphatase: 54 U/L (ref 38–126)
BUN: 6 mg/dL (ref 6–20)
CHLORIDE: 103 mmol/L (ref 101–111)
CO2: 26 mmol/L (ref 22–32)
Calcium: 9.9 mg/dL (ref 8.9–10.3)
Creatinine, Ser: 0.55 mg/dL (ref 0.44–1.00)
Glucose, Bld: 110 mg/dL — ABNORMAL HIGH (ref 65–99)
POTASSIUM: 3.8 mmol/L (ref 3.5–5.1)
Sodium: 138 mmol/L (ref 135–145)
Total Bilirubin: 0.6 mg/dL (ref 0.3–1.2)
Total Protein: 7.8 g/dL (ref 6.5–8.1)

## 2015-11-19 LAB — POCT PREGNANCY, URINE: PREG TEST UR: NEGATIVE

## 2015-11-19 LAB — TROPONIN I: Troponin I: <0.03 ng/mL (ref <0.03)

## 2015-11-19 MED ORDER — SUCRALFATE 1 G PO TABS: 1.0000 g | ORAL_TABLET | Freq: Four times a day (QID) | ORAL | 0 refills | Status: AC

## 2015-11-19 MED ORDER — GI COCKTAIL ~~LOC~~
30.0000 mL | Freq: Once | ORAL | Status: AC
  Administered 2015-11-19: 30 mL via ORAL

## 2015-11-19 MED ORDER — GI COCKTAIL ~~LOC~~
ORAL | Status: AC
Start: 2015-11-19 — End: 2015-11-19
  Administered 2015-11-19: 30 mL via ORAL
  Filled 2015-11-19: qty 30

## 2015-11-19 MED ORDER — RANITIDINE HCL 150 MG/10ML PO SYRP
150.0000 mg | ORAL_SOLUTION | Freq: Once | ORAL | Status: AC
  Administered 2015-11-19: 150 mg via ORAL
  Filled 2015-11-19 (×2): qty 10

## 2015-11-19 MED ORDER — SUCRALFATE 1 G PO TABS
1.0000 g | ORAL_TABLET | Freq: Once | ORAL | Status: AC
  Administered 2015-11-19: 1 g via ORAL

## 2015-11-19 MED ORDER — RANITIDINE HCL 150 MG PO TABS: 150.0000 mg | ORAL_TABLET | Freq: Two times a day (BID) | ORAL | 1 refills | Status: AC

## 2015-11-19 MED ORDER — SUCRALFATE 1 G PO TABS
ORAL_TABLET | ORAL | Status: AC
  Filled 2015-11-19: qty 1

## 2015-11-19 NOTE — ED Triage Notes (Signed)
Pt presents with epigastric pain for five days. Denies any other sx and with hx of ulcers. Does not take any meds on a daily basis.

## 2015-11-19 NOTE — ED Notes (Signed)
Pt. Reporting "excrutiating pain all the way down", a burning from throat to stomach. Pt. Reports hx ulcers about 10 years ago that were tx and denies complications since. Reports mild nausea, denies V/D. Denies dizziness or signs of bleeding. Pt. Reports eating less than half a slice of bread and a handful of cereal in the past 24 hours, also stating that she is unable to tolerate fluids.

## 2015-11-19 NOTE — Discharge Instructions (Signed)
Please seek medical attention for any high fevers, chest pain, shortness of breath, change in behavior, persistent vomiting, bloody stool or any other new or concerning symptoms.  

## 2015-11-19 NOTE — ED Provider Notes (Signed)
[facility] Emergency Department Provider Note   ____________________________________________   I have reviewed the triage vital signs and the nursing notes.   HISTORY  Chief Complaint Abdominal Pain   History limited by: Not Limited   HPI Erika [name] is a 35 y.o. female who presents to the emergency department today because of concerns for abdominal pain that radiates up into her chest. It is been present for roughly past 5 days. It has progressively gotten worse. It is severe. It is worse with eating. He does remind the patient when she had ulcers and number appears ago. She has not been on any acid control. She did try taking some Tums without any relief of the current pain. She denies any fevers. Denies any black tarry or bloody stool.     Past Medical History:  Diagnosis Date  . Endometriosis   . History of stomach ulcers     There are no active problems to display for this patient.   Past Surgical History:  Procedure Laterality Date  . ABDOMINAL SURGERY    . cesarian  2010   2 cesarian  . TUBAL LIGATION  2010    Current Outpatient Rx  . Order #: [id number] Class: Print  . Order #: [id number] Class: Print  . Order #: [id number] Class: Print  . Order #: [id number] Class: Print  . Order #: [id number] Class: Historical Med  . Order #: [id number] Class: Print    Allergies Tylenol [acetaminophen]  No family history on file.  Social History Social History  Substance Use Topics  . Smoking status: Current Every Day Smoker  . Smokeless tobacco: Never Used  . Alcohol use No    Review of Systems  Constitutional: Negative for fever. Cardiovascular: Negative for chest pain. Respiratory: Negative for shortness of breath. Gastrointestinal: Positive for abdominal pain. Neurological: Negative for headaches, focal weakness or numbness.  10-point ROS otherwise negative.  ____________________________________________   PHYSICAL EXAM:  VITAL  SIGNS: ED Triage Vitals  Enc Vitals Group     BP 11/19/15 1746 (!) 123/57     Pulse Rate 11/19/15 1746 99     Resp 11/19/15 1746 20     Temp 11/19/15 1746 98.2 F (36.8 C)     Temp Source 11/19/15 1746 Oral     SpO2 11/19/15 1746 98 %     Weight 11/19/15 1746 110 lb (49.9 kg)     Height 11/19/15 1746  (1.499 m)     Head Circumference --      Peak Flow --      Pain Score 11/19/15 1801 9   Constitutional: Alert and oriented. Well appearing and in no distress. Eyes: Conjunctivae are normal. PERRL. Normal extraocular movements. ENT   Head: Normocephalic and atraumatic.   Nose: No congestion/rhinnorhea.   Mouth/Throat: Mucous membranes are moist.   Neck: No stridor. Hematological/Lymphatic/Immunilogical: No cervical lymphadenopathy. Cardiovascular: Normal rate, regular rhythm.  No murmurs, rubs, or gallops. Respiratory: Normal respiratory effort without tachypnea nor retractions. Breath sounds are clear and equal bilaterally. No wheezes/rales/rhonchi. Gastrointestinal: Soft and nontender. No distention. There is no CVA tenderness. Genitourinary: Deferred Musculoskeletal: Normal range of motion in all extremities. No joint effusions.  No lower extremity tenderness nor edema. Neurologic:  Normal speech and language. No gross focal neurologic deficits are appreciated.  Skin:  Skin is warm, dry and intact. No rash noted. Psychiatric: Mood and affect are normal. Speech and behavior are normal. Patient exhibits appropriate insight and judgment.  ____________________________________________    LABS (pertinent positives/negatives)  None  ____________________________________________   EKG  I, Phineas Semen, attending physician, personally viewed and interpreted this EKG  EKG Time: 1756 Rate: 102 Rhythm: sinus tachycardia Axis: normal Intervals: qtc 417 QRS: narrow ST changes: no st elevation Impression: abnormal  ekg   ____________________________________________    RADIOLOGY  None   ____________________________________________   PROCEDURES  Procedures  ____________________________________________   INITIAL IMPRESSION / ASSESSMENT AND PLAN / ED COURSE  Pertinent labs & imaging results that were available during my care of the patient were reviewed by me and considered in my medical decision making (see chart for details).  Patient with epigastric and chest pain. History of ulcers. Patient did get some relief from GI cocktail. Think gastritis/ulcers likely. Patient does take a lot of ibuprofen due to chronic back pain. Will give sucralfate and antacid medication ____________________________________________   FINAL CLINICAL IMPRESSION(S) / ED DIAGNOSES  Final diagnoses:  Gastritis     Note: This dictation was prepared with Dragon dictation. Any transcriptional errors that result from this process are unintentional    Phineas Semen, MD 11/19/15 2052

## 2015-11-19 NOTE — ED Notes (Signed)
Pt. Verbalizes understanding of d/c instructions, prescriptions, and follow-up. VS stable.  Pt. In NAD at time of d/c and denies questions. Pt. Ambulatory Out of the unit with friend.
